# Patient Record
Sex: Female | Born: 1990 | Race: White | Hispanic: No | Marital: Single | Smoking: Current every day smoker
Health system: Southern US, Community
[De-identification: ages and names within clinical notes are randomized; demographics above are authoritative.]

---
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
## 2019-04-05 ENCOUNTER — Ambulatory Visit
Admission: EM | Admit: 2019-04-05 | Discharge: 2019-04-05 | Disposition: A | Discharge status: Home / Self Care | Payer: Self-pay | Attending: Family Medicine | Admitting: Family Medicine

## 2019-04-05 ENCOUNTER — Other Ambulatory Visit: Payer: Self-pay

## 2019-04-05 ENCOUNTER — Ambulatory Visit (INDEPENDENT_AMBULATORY_CARE_PROVIDER_SITE_OTHER): Payer: Self-pay

## 2019-04-05 DIAGNOSIS — Z87898 Personal history of other specified conditions: Secondary | ICD-10-CM

## 2019-04-05 DIAGNOSIS — F17210 Nicotine dependence, cigarettes, uncomplicated: Secondary | ICD-10-CM

## 2019-04-05 DIAGNOSIS — R0602 Shortness of breath: Secondary | ICD-10-CM

## 2019-04-05 MED ORDER — ALBUTEROL SULFATE HFA 108 (90 BASE) MCG/ACT IN AERS: 1.0000 | INHALATION_SPRAY | Freq: Four times a day (QID) | RESPIRATORY_TRACT | 0 refills | Status: AC | PRN

## 2019-04-05 NOTE — ED Provider Notes (Signed)
[facility]    CSN: [id number] Arrival date & time: 04/05/19  1653     History   Chief Complaint Chief Complaint  Patient presents with  . Shortness of Breath    HPI Valerie Clay is a 28 y.o. female.   28 yo female with a c/o shortness of breath for the last 3-4 days. Worse with exertion and states has heard some wheezing but denies history of asthma. Denies any fevers, chills, cough.   The history is provided by the patient.  Shortness of Breath    History reviewed. No pertinent past medical history.  There are no active problems to display for this patient.   Past Surgical History:  Procedure Laterality Date  . ANTERIOR CRUCIATE LIGAMENT REPAIR Left 2010    OB History   No obstetric history on file.      Home Medications    Prior to Admission medications   Medication Sig Start Date End Date Taking? Authorizing Provider  albuterol (PROVENTIL HFA;VENTOLIN HFA) 108 (90 Base) MCG/ACT inhaler Inhale 1-2 puffs into the lungs every 6 (six) hours as needed for wheezing or shortness of breath. 04/05/19   Payton Mccallum, MD    Family History History reviewed. No pertinent family history.  Social History Social History   Tobacco Use  . Smoking status: Current Every Day Smoker    Packs/day: 0.25    Types: Cigarettes  . Smokeless tobacco: Never Used  Substance Use Topics  . Alcohol use: Not Currently  . Drug use: Not Currently     Allergies   Patient has no known allergies.   Review of Systems Review of Systems  Respiratory: Positive for shortness of breath.      Physical Exam Triage Vital Signs ED Triage Vitals  Enc Vitals Group     BP 04/05/19 1713 (!) 148/81     Pulse Rate 04/05/19 1713 (!) 110     Resp 04/05/19 1713 16     Temp 04/05/19 1713 98.4 F (36.9 C)     Temp src --      SpO2 04/05/19 1713 100 %     Weight 04/05/19 1710 290 lb (131.5 kg)     Height 04/05/19 1710 5\' 3"  (1.6 m)     Head Circumference --      Peak  Flow --      Pain Score 04/05/19 1710 4     Pain Loc --      Pain Edu? --      Excl. in GC? --    No data found.  Updated Vital Signs BP (!) 148/81 (BP Location: Left Arm)   Pulse (!) 110   Temp 98.4 F (36.9 C)   Resp 16   Ht 5\' 3"  (1.6 m)   Wt 131.5 kg   LMP 03/31/2019   SpO2 100%   BMI 51.37 kg/m   Visual Acuity Right Eye Distance:   Left Eye Distance:   Bilateral Distance:    Right Eye Near:   Left Eye Near:    Bilateral Near:     Physical Exam Vitals signs and nursing note reviewed.  Constitutional:      General: She is not in acute distress.    Appearance: She is not ill-appearing, toxic-appearing or diaphoretic.  Pulmonary:     Effort: Pulmonary effort is normal. No respiratory distress.  Neurological:     Mental Status: She is alert.      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 Vascularity normal. Lungs are clear without infiltrate or effusion. IMPRESSION: No active cardiopulmonary disease. Electronically Signed   By: Marlan Palauharles  Clark M.D.   On: 04/05/2019 18:17    Procedures Procedures (including critical care time)  Medications Ordered in UC Medications - No data to display  Initial Impression / Assessment and Plan / UC Course  I have reviewed the triage vital signs and the nursing notes.  Pertinent labs & imaging results that were available during my care of the patient were reviewed by me and considered in my medical decision making (see chart for details).      Final Clinical Impressions(s) / UC Diagnoses   Final diagnoses:  Shortness of breath  H/O wheezing     Discharge Instructions     Monitor per [place] DHHS guidelines (copy given)    ED  Prescriptions    Medication Sig Dispense Auth. Provider   albuterol (PROVENTIL HFA;VENTOLIN HFA) 108 (90 Base) MCG/ACT inhaler Inhale 1-2 puffs into the lungs every 6 (six) hours as needed for wheezing or shortness of breath. 1 Inhaler Payton Mccallumonty, Jadyn Brasher, MD     1. Chest x-ray results (normal/negative) and possible diagnoses reviewed with patient  2. rx as per orders above; reviewed possible side effects, interactions, risks and benefits; offered medication for anxiety however patient declined 3. Recommend supportive treatment with rest, monitor signs/symptoms per [place] DHHS guidelines 4. Follow-up prn   Controlled Substance Prescriptions [place] Controlled Substance Registry consulted? Not Applicable   Payton Mccallumonty, Desiray Orchard, MD 04/05/19 2010

## 2019-04-05 NOTE — ED Triage Notes (Signed)
Patient complains of shortness of breath that started 3-4 days ago and has remained constant. Worse with movement. Patient states that she feel like cannot take a deep breath.

## 2019-04-05 NOTE — Discharge Instructions (Addendum)
Monitor per [place] DHHS guidelines (copy given)

## 2020-12-02 IMAGING — CR CHEST - 2 VIEW
2 series · 3 of 3 positions shown · non-contrast
Comparison: None

CLINICAL DATA: Short of breath

EXAM:
CHEST - 2 VIEW

[chest pa]
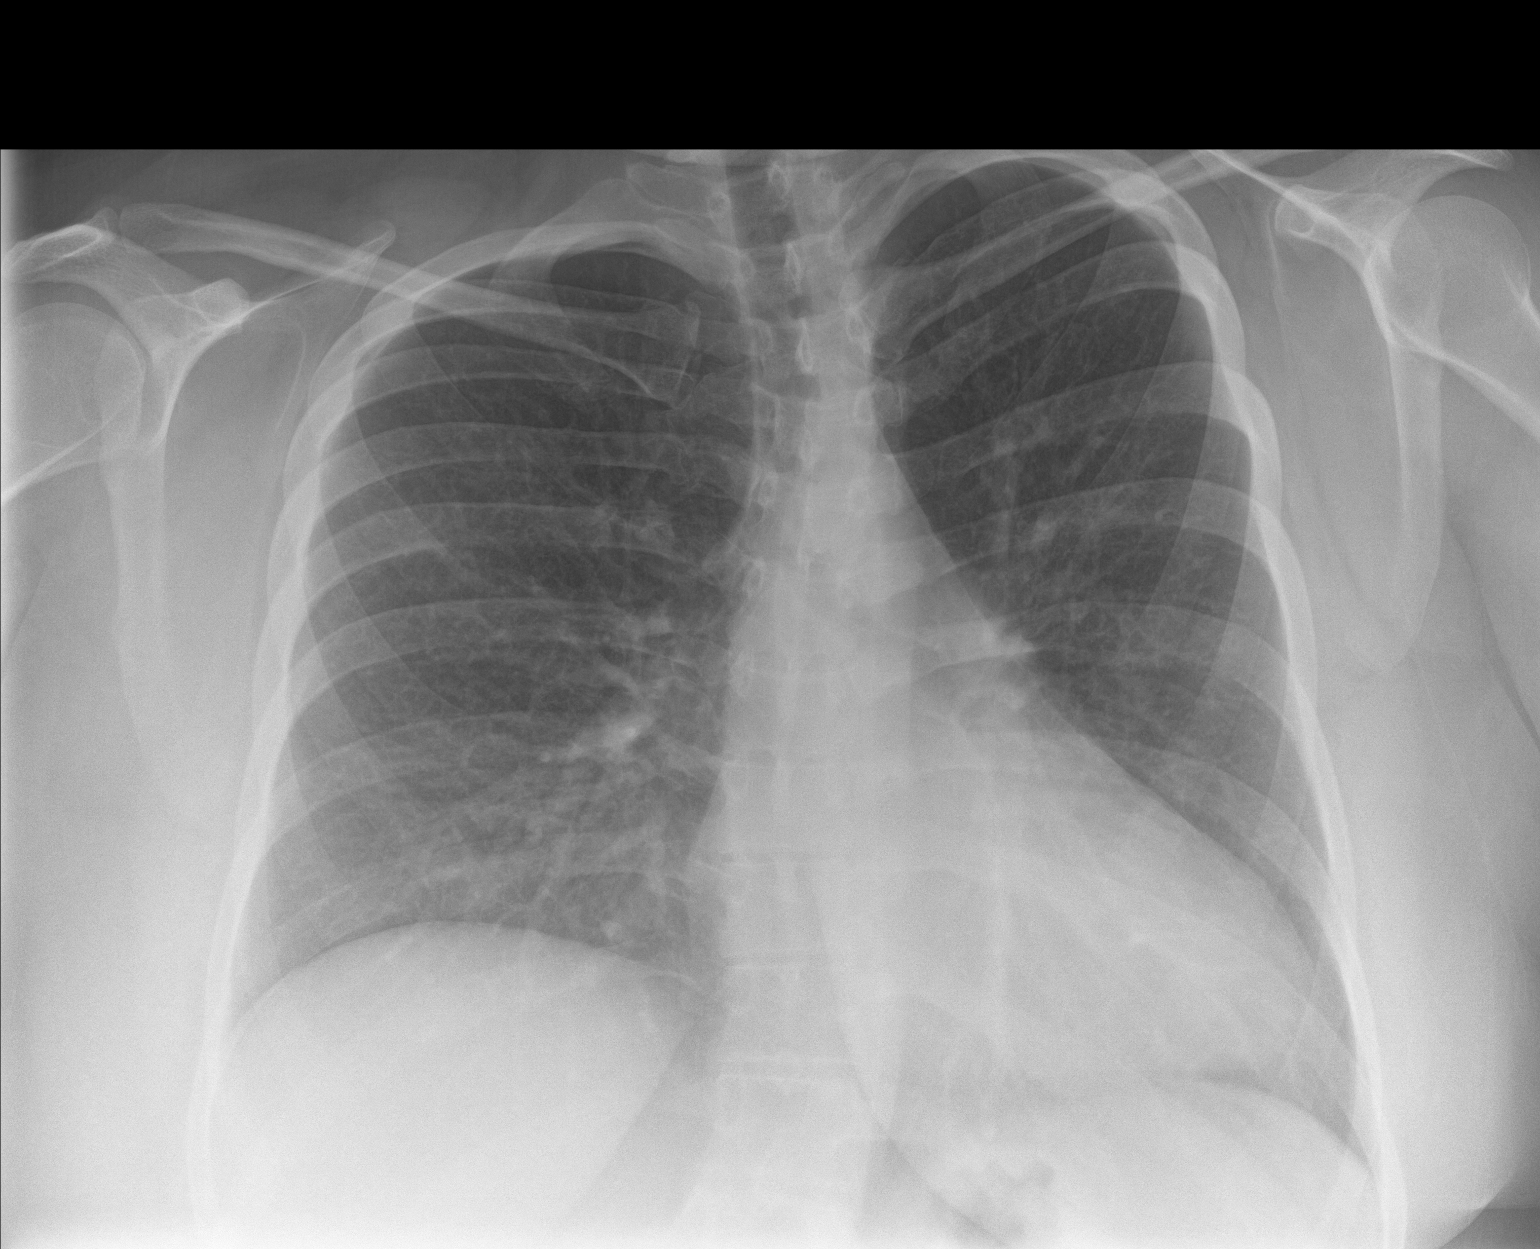

[Series 2: chest lat · 0.14mm/px · 2 of 2 slices shown]
[im 1/2]
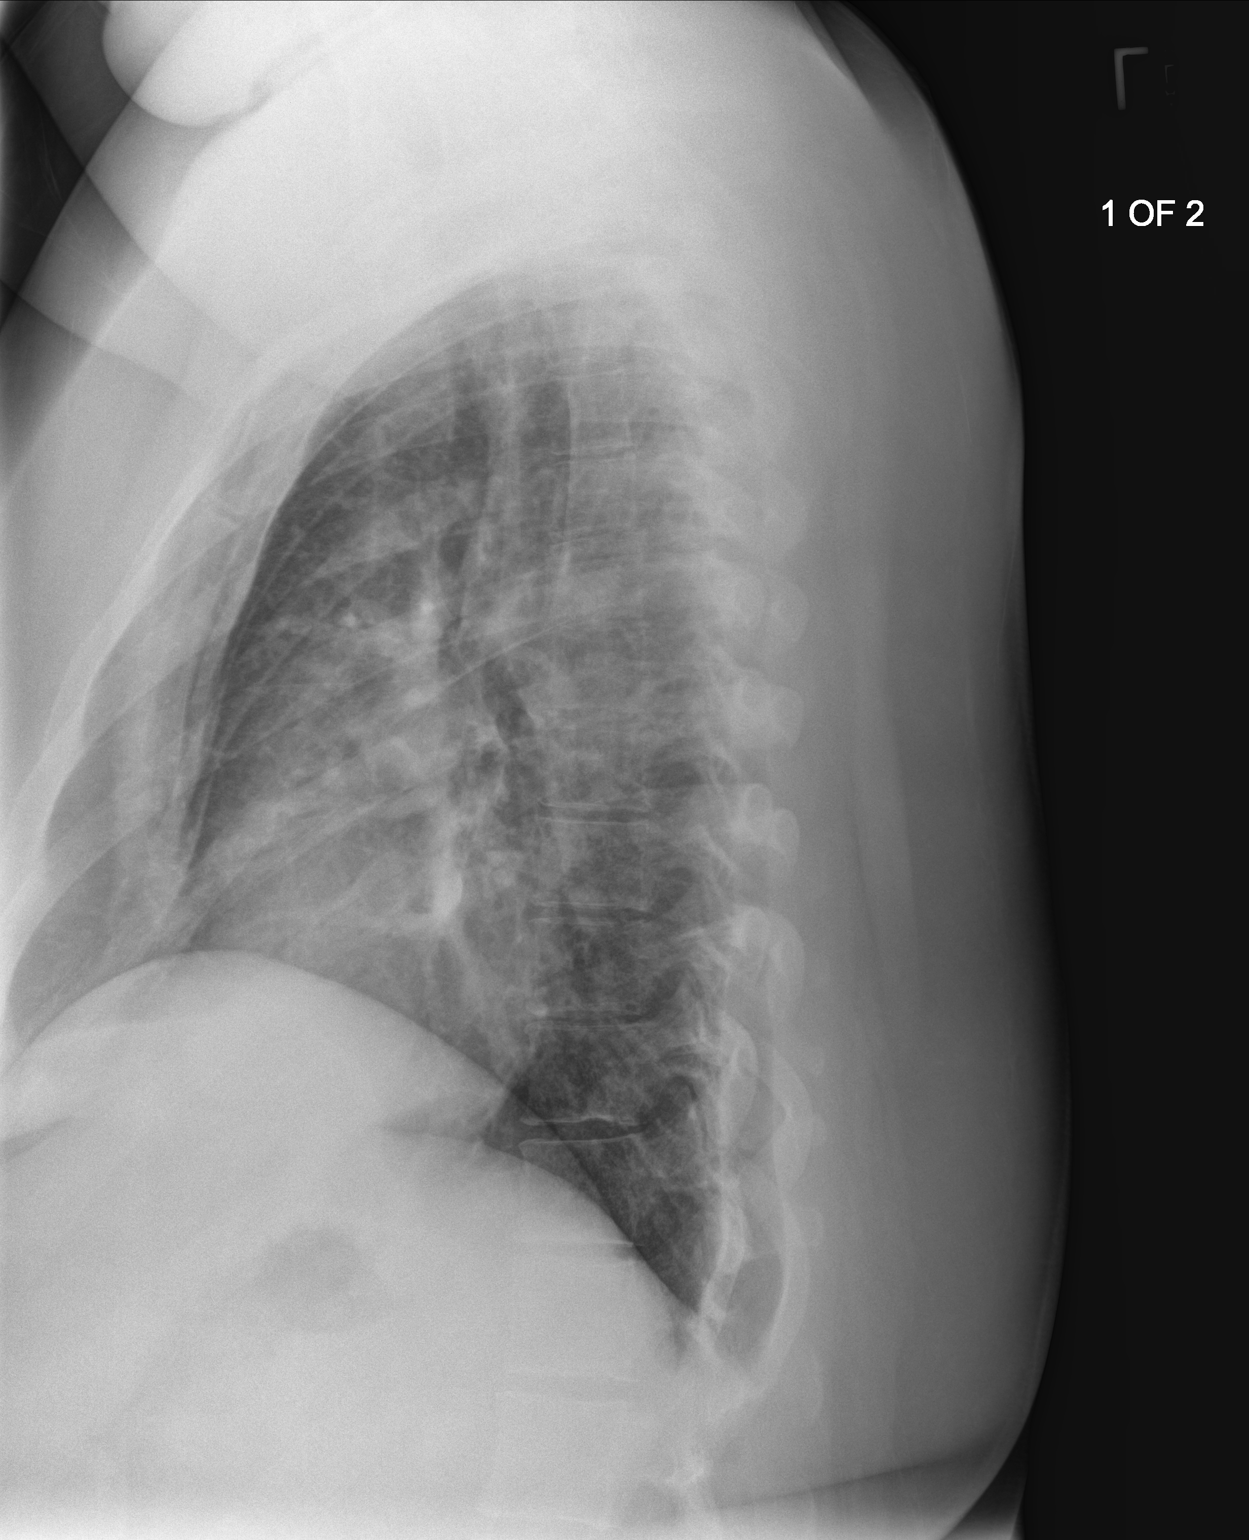
[im 2/2]
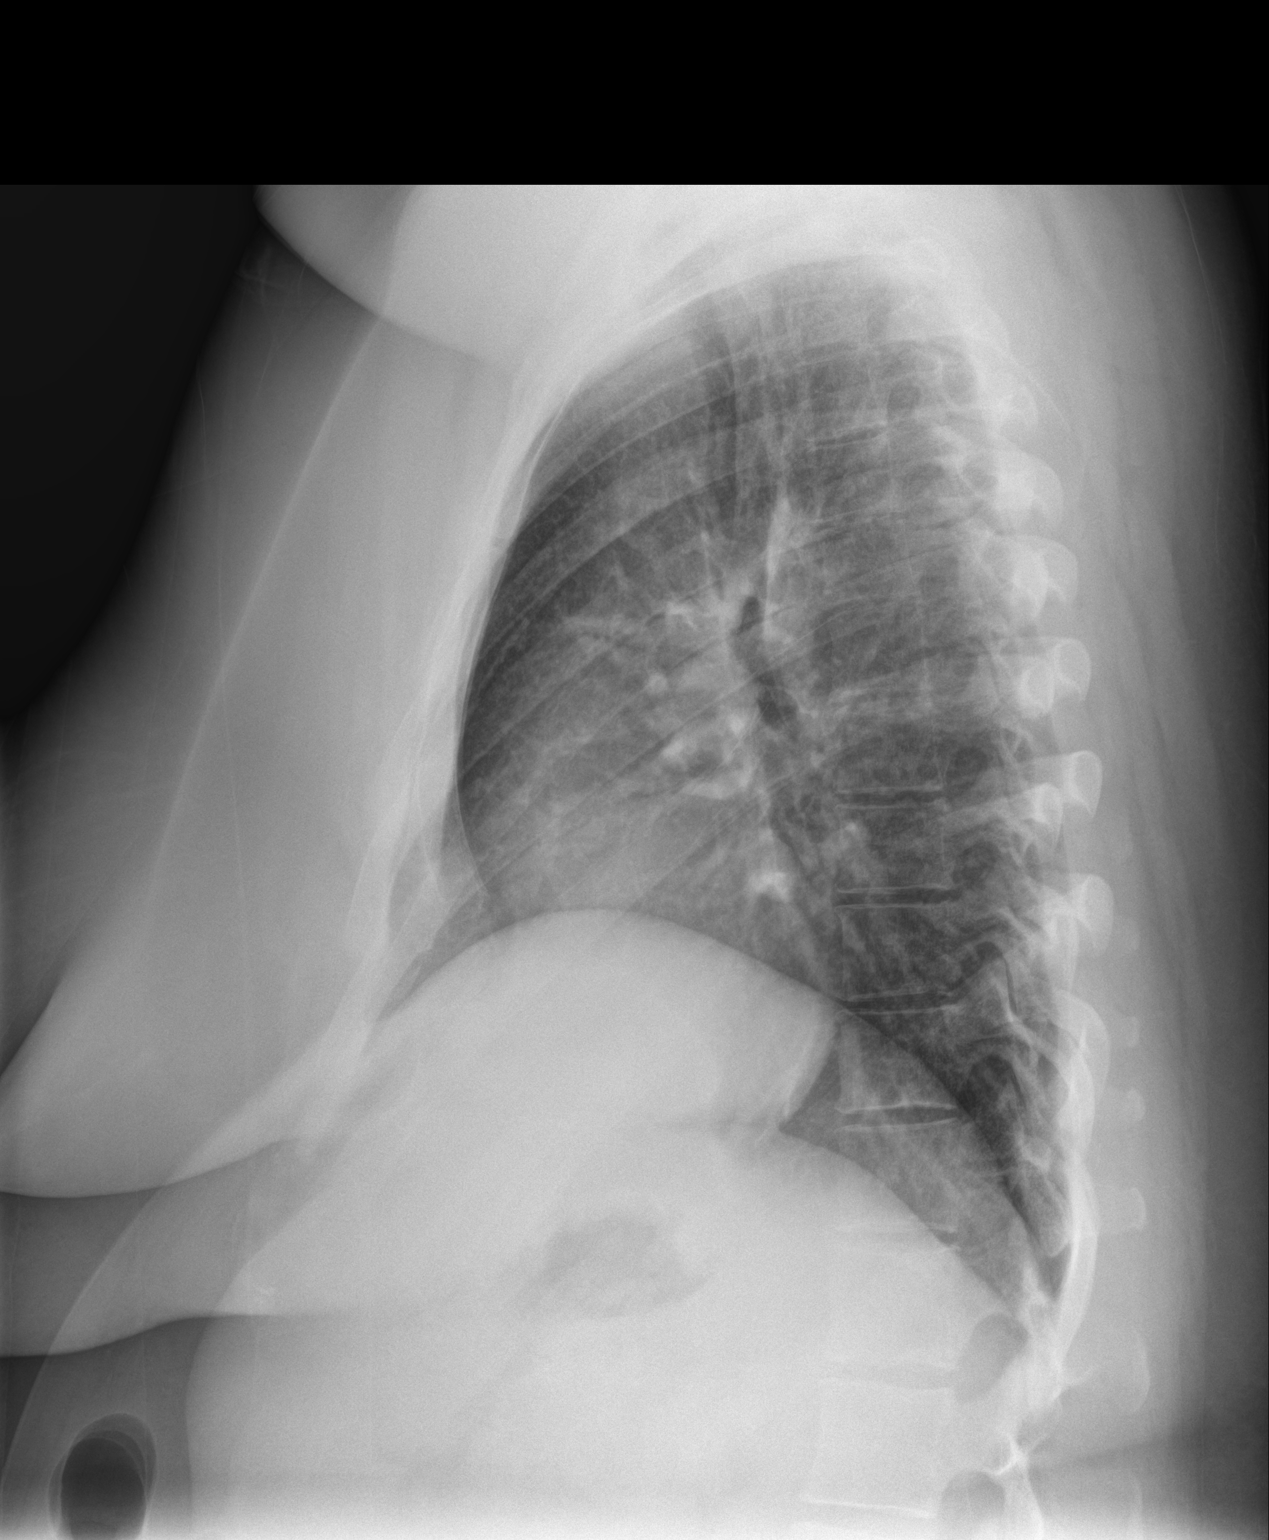

[3 of 3 positions shown; findings below may reference images not displayed]

FINDINGS: Pectus deformity displacing the heart to the left. Vascularity
normal. Lungs are clear without infiltrate or effusion.
IMPRESSION: No active cardiopulmonary disease.
